# Patient Record
Sex: Male | Born: 1975 | Race: Black or African American | Hispanic: No | Marital: Single | Smoking: Never smoker
Health system: Southern US, Community
[De-identification: ages and names within clinical notes are randomized; demographics above are authoritative.]

## PROBLEM LIST (undated history)

## (undated) DIAGNOSIS — N200 Calculus of kidney: Secondary | ICD-10-CM

## (undated) DIAGNOSIS — K760 Fatty (change of) liver, not elsewhere classified: Secondary | ICD-10-CM

## (undated) DIAGNOSIS — E119 Type 2 diabetes mellitus without complications: Secondary | ICD-10-CM

## (undated) DIAGNOSIS — I10 Essential (primary) hypertension: Secondary | ICD-10-CM

## (undated) HISTORY — PX: APPENDECTOMY: SHX54

## (undated) HISTORY — DX: Essential (primary) hypertension: I10

## (undated) HISTORY — DX: Fatty (change of) liver, not elsewhere classified: K76.0

---

## 2016-02-27 ENCOUNTER — Emergency Department: Payer: BLUE CROSS/BLUE SHIELD

## 2016-02-27 ENCOUNTER — Emergency Department
Admission: EM | Admit: 2016-02-27 | Discharge: 2016-02-27 | Disposition: A | Discharge status: Home / Self Care | Payer: BLUE CROSS/BLUE SHIELD | Attending: Student | Admitting: Student

## 2016-02-27 ENCOUNTER — Encounter: Payer: Self-pay | Admitting: Emergency Medicine

## 2016-02-27 DIAGNOSIS — E119 Type 2 diabetes mellitus without complications: Secondary | ICD-10-CM | POA: Insufficient documentation

## 2016-02-27 DIAGNOSIS — N200 Calculus of kidney: Secondary | ICD-10-CM | POA: Diagnosis not present

## 2016-02-27 DIAGNOSIS — R109 Unspecified abdominal pain: Secondary | ICD-10-CM | POA: Diagnosis present

## 2016-02-27 HISTORY — DX: Calculus of kidney: N20.0

## 2016-02-27 HISTORY — DX: Type 2 diabetes mellitus without complications: E11.9

## 2016-02-27 LAB — URINALYSIS COMPLETE WITH MICROSCOPIC (ARMC ONLY)
BACTERIA UA: NONE SEEN
BILIRUBIN URINE: NEGATIVE
Ketones, ur: NEGATIVE mg/dL
LEUKOCYTES UA: NEGATIVE
Nitrite: NEGATIVE
Protein, ur: 100 mg/dL — AB
SQUAMOUS EPITHELIAL / LPF: NONE SEEN
Specific Gravity, Urine: 1.03 (ref 1.005–1.030)
WBC UA: NONE SEEN WBC/hpf (ref 0–5)
pH: 5 (ref 5.0–8.0)

## 2016-02-27 LAB — CBC
HEMATOCRIT: 48.8 % (ref 40.0–52.0)
HEMOGLOBIN: 16.4 g/dL (ref 13.0–18.0)
MCH: 28.4 pg (ref 26.0–34.0)
MCHC: 33.6 g/dL (ref 32.0–36.0)
MCV: 84.5 fL (ref 80.0–100.0)
PLATELETS: 347 10*3/uL (ref 150–440)
RBC: 5.78 MIL/uL (ref 4.40–5.90)
RDW: 14 % (ref 11.5–14.5)
WBC: 11.4 10*3/uL — ABNORMAL HIGH (ref 3.8–10.6)

## 2016-02-27 LAB — BASIC METABOLIC PANEL
Anion gap: 8 (ref 5–15)
BUN: 22 mg/dL — ABNORMAL HIGH (ref 6–20)
CHLORIDE: 104 mmol/L (ref 101–111)
CO2: 26 mmol/L (ref 22–32)
CREATININE: 1.72 mg/dL — AB (ref 0.61–1.24)
Calcium: 9.6 mg/dL (ref 8.9–10.3)
GFR calc non Af Amer: 47 mL/min — ABNORMAL LOW (ref >60)
GFR, EST AFRICAN AMERICAN: 55 mL/min — AB (ref >60)
Glucose, Bld: 200 mg/dL — ABNORMAL HIGH (ref 65–99)
Potassium: 4.1 mmol/L (ref 3.5–5.1)
Sodium: 138 mmol/L (ref 135–145)

## 2016-02-27 MED ORDER — ONDANSETRON HCL 4 MG/2ML IJ SOLN
4.0000 mg | Freq: Once | INTRAMUSCULAR | Status: AC
  Administered 2016-02-27: 4 mg via INTRAVENOUS

## 2016-02-27 MED ORDER — MORPHINE SULFATE (PF) 4 MG/ML IV SOLN
INTRAVENOUS | Status: AC
  Filled 2016-02-27: qty 1

## 2016-02-27 MED ORDER — NAPROXEN 500 MG PO TABS: 500.0000 mg | ORAL_TABLET | Freq: Two times a day (BID) | ORAL | Status: AC

## 2016-02-27 MED ORDER — SODIUM CHLORIDE 0.9 % IV BOLUS (SEPSIS)
1000.0000 mL | Freq: Once | INTRAVENOUS | Status: AC
  Administered 2016-02-27: 1000 mL via INTRAVENOUS

## 2016-02-27 MED ORDER — KETOROLAC TROMETHAMINE 30 MG/ML IJ SOLN
30.0000 mg | Freq: Once | INTRAMUSCULAR | Status: AC
  Administered 2016-02-27: 30 mg via INTRAVENOUS
  Filled 2016-02-27: qty 1

## 2016-02-27 MED ORDER — MORPHINE SULFATE (PF) 4 MG/ML IV SOLN
4.0000 mg | Freq: Once | INTRAVENOUS | Status: AC
  Administered 2016-02-27: 4 mg via INTRAVENOUS

## 2016-02-27 MED ORDER — ONDANSETRON HCL 4 MG/2ML IJ SOLN
INTRAMUSCULAR | Status: AC
  Filled 2016-02-27: qty 2

## 2016-02-27 MED ORDER — OXYCODONE HCL 5 MG PO TABS: 5.0000 mg | ORAL_TABLET | Freq: Three times a day (TID) | ORAL | Status: AC | PRN

## 2016-02-27 NOTE — Discharge Instructions (Signed)
Kidney Stones °Kidney stones (urolithiasis) are deposits that form inside your kidneys. The intense pain is caused by the stone moving through the urinary tract. When the stone moves, the ureter goes into spasm around the stone. The stone is usually passed in the urine.  °CAUSES  °· A disorder that makes certain neck glands produce too much parathyroid hormone (primary hyperparathyroidism). °· A buildup of uric acid crystals, similar to gout in your joints. °· Narrowing (stricture) of the ureter. °· A kidney obstruction present at birth (congenital obstruction). °· Previous surgery on the kidney or ureters. °· Numerous kidney infections. °SYMPTOMS  °· Feeling sick to your stomach (nauseous). °· Throwing up (vomiting). °· Blood in the urine (hematuria). °· Pain that usually spreads (radiates) to the groin. °· Frequency or urgency of urination. °DIAGNOSIS  °· Taking a history and physical exam. °· Blood or urine tests. °· CT scan. °· Occasionally, an examination of the inside of the urinary bladder (cystoscopy) is performed. °TREATMENT  °· Observation. °· Increasing your fluid intake. °· Extracorporeal shock wave lithotripsy--This is a noninvasive procedure that uses shock waves to break up kidney stones. °· Surgery may be needed if you have severe pain or persistent obstruction. There are various surgical procedures. Most of the procedures are performed with the use of small instruments. Only small incisions are needed to accommodate these instruments, so recovery time is minimized. °The size, location, and chemical composition are all important variables that will determine the proper choice of action for you. Talk to your health care provider to better understand your situation so that you will minimize the risk of injury to yourself and your kidney.  °HOME CARE INSTRUCTIONS  °· Drink enough water and fluids to keep your urine clear or pale yellow. This will help you to pass the stone or stone fragments. °· Strain  all urine through the provided strainer. Keep all particulate matter and stones for your health care provider to see. The stone causing the pain may be as small as a grain of salt. It is very important to use the strainer each and every time you pass your urine. The collection of your stone will allow your health care provider to analyze it and verify that a stone has actually passed. The stone analysis will often identify what you can do to reduce the incidence of recurrences. °· Only take over-the-counter or prescription medicines for pain, discomfort, or fever as directed by your health care provider. °· Keep all follow-up visits as told by your health care provider. This is important. °· Get follow-up X-rays if required. The absence of pain does not always mean that the stone has passed. It may have only stopped moving. If the urine remains completely obstructed, it can cause loss of kidney function or even complete destruction of the kidney. It is your responsibility to make sure X-rays and follow-ups are completed. Ultrasounds of the kidney can show blockages and the status of the kidney. Ultrasounds are not associated with any radiation and can be performed easily in a matter of minutes. °· Make changes to your daily diet as told by your health care provider. You may be told to: °¨ Limit the amount of salt that you eat. °¨ Eat 5 or more servings of fruits and vegetables each day. °¨ Limit the amount of meat, poultry, fish, and eggs that you eat. °· Collect a 24-hour urine sample as told by your health care provider. You may need to collect another urine sample every 6-12   months. °SEEK MEDICAL CARE IF: °· You experience pain that is progressive and unresponsive to any pain medicine you have been prescribed. °SEEK IMMEDIATE MEDICAL CARE IF:  °· Pain cannot be controlled with the prescribed medicine. °· You have a fever or shaking chills. °· The severity or intensity of pain increases over 18 hours and is not  relieved by pain medicine. °· You develop a new onset of abdominal pain. °· You feel faint or pass out. °· You are unable to urinate. °  °This information is not intended to replace advice given to you by your health care provider. Make sure you discuss any questions you have with your health care provider. °  °Document Released: 09/20/2005 Document Revised: 06/11/2015 Document Reviewed: 02/21/2013 °Elsevier Interactive Patient Education ©2016 [employer] ° °

## 2016-02-27 NOTE — ED Provider Notes (Signed)
[facility] Emergency Department Provider Note  ___________________________________________  Time seen: Approximately 4:58 PM  I have reviewed the triage vital signs and the nursing notes.   HISTORY  Chief Complaint Flank Pain  HPI Kyle Barrett is a 40 y.o. male who presents to the emergency department for evaluation of right flank pain. Pain started gradually on Wednesday and increased significantly this morning. He has a history of renal stone--last in 2015 that was "too big to pass." He went in for lithotripsy, but states they were unable to locate the stone. He reports having blood in his urine this morning, which is a new symptom. He denies nausea or vomiting. He states that he took an aspirin for pain.   Past Medical History  Diagnosis Date  . Diabetes mellitus without complication (HCC)   . Kidney stones     There are no active problems to display for this patient.   Past Surgical History  Procedure Laterality Date  . Appendectomy      Current Outpatient Rx  Name  Route  Sig  Dispense  Refill  . glipiZIDE (GLUCOTROL) 10 MG tablet   Oral   Take 10 mg by mouth daily before breakfast.         . sitaGLIPtin-metformin (JANUMET) 50-500 MG tablet   Oral   Take 1 tablet by mouth 2 (two) times daily with a meal.         . Dulaglutide (TRULICITY) 1.5 MG/0.5ML SOPN   Subcutaneous   Inject into the skin.         . naproxen (NAPROSYN) 500 MG tablet   Oral   Take 1 tablet (500 mg total) by mouth 2 (two) times daily with a meal.   60 tablet   0   . oxyCODONE (ROXICODONE) 5 MG immediate release tablet   Oral   Take 1 tablet (5 mg total) by mouth every 8 (eight) hours as needed.   20 tablet   0     Allergies Review of patient's allergies indicates no known allergies.  No family history on file.  Social History Social History  Substance Use Topics  . Smoking status: Never Smoker   . Smokeless tobacco: None  . Alcohol  Use: No    Review of Systems Constitutional: No fever/chills Eyes: No visual changes. ENT: No sore throat. Cardiovascular: Denies chest pain. Respiratory: Denies shortness of breath. Gastrointestinal: No abdominal pain.  No nausea, no vomiting. Genitourinary: Positive for dysuria. Musculoskeletal: Positive for back pain. Skin: Negative for rash. Neurological: Negative for headaches, focal weakness or numbness. ____________________________________________   PHYSICAL EXAM:  VITAL SIGNS: ED Triage Vitals  Enc Vitals Group     BP 02/27/16 1624 153/104 mmHg     Pulse Rate 02/27/16 1624 120     Resp 02/27/16 1624 20     Temp 02/27/16 1624 98.6 F (37 C)     Temp Source 02/27/16 1624 Oral     SpO2 02/27/16 1624 98 %     Weight 02/27/16 1624 245 lb (111.131 kg)     Height 02/27/16 1624  (1.88 m)     Head Cir --      Peak Flow --      Pain Score 02/27/16 1622 8     Pain Loc --      Pain Edu? --      Excl. in GC? --     Constitutional: Alert and oriented. Uncomfortable appearing and in no acute distress. Eyes:  Conjunctivae are normal. PERRL. EOMI. Head: Atraumatic. Respiratory: Normal respiratory effort.  No retractions. Lungs CTAB. Gastrointestinal: Soft and nontender. No distention. No abdominal bruits. CVA tenderness on the right. Musculoskeletal: No lower extremity tenderness nor edema.  No joint effusions. Neurologic:  Normal speech and language. No gross focal neurologic deficits are appreciated. No gait instability. Skin:  Skin is warm, dry and intact. No rash noted. Psychiatric: Mood and affect are normal. Speech and behavior are normal.  ____________________________________________   LABS (all labs ordered are listed, but only abnormal results are displayed)  Labs Reviewed  URINALYSIS COMPLETEWITH MICROSCOPIC ([facility] ONLY) - Abnormal; Notable for the following:    Color, Urine STRAW (*)    APPearance CLEAR (*)    Glucose, UA >500 (*)    Hgb urine dipstick  3+ (*)    Protein, ur 100 (*)    All other components within normal limits  BASIC METABOLIC PANEL - Abnormal; Notable for the following:    Glucose, Bld 200 (*)    BUN 22 (*)    Creatinine, Ser 1.72 (*)    GFR calc non Af Amer 47 (*)    GFR calc Af Amer 55 (*)    All other components within normal limits  CBC - Abnormal; Notable for the following:    WBC 11.4 (*)    All other components within normal limits   ____________________________________________  EKG   ____________________________________________  RADIOLOGY  Four stones in the UVJ ranging from 2-734mm causing mild obstructive change per radiology. ____________________________________________   PROCEDURES  Procedure(s) performed: None  Critical Care performed: No  ____________________________________________   INITIAL IMPRESSION / ASSESSMENT AND PLAN / ED COURSE  Pertinent labs & imaging results that were available during my care of the patient were reviewed by me and considered in my medical decision making (see chart for details).  Pain well controlled after IV fluids and administration of Morphine and Toradol. He will be discharged home with Roxicet and advised to follow up with urology. He was advised to return to the ER for symptoms that change or worsen or for new concerns.  ____________________________________________   FINAL CLINICAL IMPRESSION(S) / ED DIAGNOSES  Final diagnoses:  Nephrolithiasis      NEW MEDICATIONS STARTED DURING THIS VISIT:  Discharge Medication List as of 02/27/2016  6:43 PM    START taking these medications   Details  naproxen (NAPROSYN) 500 MG tablet Take 1 tablet (500 mg total) by mouth 2 (two) times daily with a meal., Starting 02/27/2016, Until Sat 02/26/17, Print    oxyCODONE (ROXICODONE) 5 MG immediate release tablet Take 1 tablet (5 mg total) by mouth every 8 (eight) hours as needed., Starting 02/27/2016, Until Sat 02/26/17, Print         Note:  This document was  prepared using Dragon voice recognition software and may include unintentional dictation errors.    Chinita PesterCari B Camauri Craton, FNP 02/28/16 1006  Myrna Blazeravid Matthew Schaevitz, MD 02/29/16 [phone]

## 2016-02-27 NOTE — ED Notes (Signed)
Sudden onset of right flank pain with some urinary discomfort  Voiding small amts with some noticeable blood in urine

## 2018-03-08 ENCOUNTER — Telehealth: Payer: Self-pay | Admitting: General Practice

## 2018-03-08 NOTE — Telephone Encounter (Signed)
Lm on vm to call office to set up a new patient appt with Dr. Jearld PiesMcClean or M. Arnett.

## 2018-04-27 ENCOUNTER — Encounter

## 2018-04-28 ENCOUNTER — Ambulatory Visit: Payer: BLUE CROSS/BLUE SHIELD | Admitting: Internal Medicine

## 2018-07-14 ENCOUNTER — Ambulatory Visit (INDEPENDENT_AMBULATORY_CARE_PROVIDER_SITE_OTHER): Payer: BLUE CROSS/BLUE SHIELD | Admitting: Internal Medicine

## 2018-07-14 ENCOUNTER — Encounter: Payer: Self-pay | Admitting: Internal Medicine

## 2018-07-14 VITALS — BP 140/80 | HR 102 | Temp 98.1°F | Resp 16 | Ht 74.5 in | Wt 253.0 lb

## 2018-07-14 DIAGNOSIS — E1165 Type 2 diabetes mellitus with hyperglycemia: Secondary | ICD-10-CM | POA: Diagnosis not present

## 2018-07-14 DIAGNOSIS — Z794 Long term (current) use of insulin: Secondary | ICD-10-CM

## 2018-07-14 DIAGNOSIS — M25561 Pain in right knee: Secondary | ICD-10-CM | POA: Insufficient documentation

## 2018-07-14 DIAGNOSIS — K760 Fatty (change of) liver, not elsewhere classified: Secondary | ICD-10-CM | POA: Diagnosis not present

## 2018-07-14 DIAGNOSIS — I10 Essential (primary) hypertension: Secondary | ICD-10-CM | POA: Diagnosis not present

## 2018-07-14 DIAGNOSIS — Z87442 Personal history of urinary calculi: Secondary | ICD-10-CM | POA: Insufficient documentation

## 2018-07-14 NOTE — Progress Notes (Addendum)
Chief Complaint  Patient presents with  . New Patient (Initial Visit)  . Knee Pain    right knee, cannot extend out   F/u  1. HTN sl elevated today goal 130/80 on lis 40 mg qd  2. DM 2 uncontrolled A1C 8.7 06/2028 f/u Dr. Gabriel Carina on Sol[place]a 100/33, glipizide 10 mg bid, Xigduo bid f/u 09/2018 labs and f/u Dr. Gabriel Carina  3. C/o right knee pain 0/10 but pain with ROM pain is middle of knee with reduced ROM x 3 weeks  4. Fatty liver noted on CT 02/27/16    Review of Systems  Constitutional: Negative for weight loss.  HENT: Negative for hearing loss.   Eyes: Negative for blurred vision.  Respiratory: Negative for shortness of breath.   Cardiovascular: Negative for chest pain.  Gastrointestinal: Negative for abdominal pain.  Musculoskeletal: Positive for back pain.  Skin: Negative for rash.  Neurological: Negative for headaches.  Psychiatric/Behavioral: Negative for depression.   Past Medical History:  Diagnosis Date  . Diabetes mellitus without complication ([place])    2 since 89s Dr. Gabriel Carina   . Fatty liver    CT 02/27/16   . Hypertension   . Kidney stones    Past Surgical History:  Procedure Laterality Date  . APPENDECTOMY     1984   Family History  Problem Relation Age of Onset  . Diabetes Paternal Grandmother    Social History   Socioeconomic History  . Marital status: Single    Spouse name: Not on file  . Number of children: Not on file  . Years of education: Not on file  . Highest education level: Not on file  Occupational History  . Not on file  Social Needs  . Financial resource strain: Not on file  . Food insecurity:    Worry: Not on file    Inability: Not on file  . Transportation needs:    Medical: Not on file    Non-medical: Not on file  Tobacco Use  . Smoking status: Never Smoker  . Smokeless tobacco: Never Used  Substance and Sexual Activity  . Alcohol use: No  . Drug use: Never  . Sexual activity: Yes  Lifestyle  . Physical activity:    Days per week:  Not on file    Minutes per session: Not on file  . Stress: Not on file  Relationships  . Social connections:    Talks on phone: Not on file    Gets together: Not on file    Attends religious service: Not on file    Active member of club or organization: Not on file    Attends meetings of clubs or organizations: Not on file    Relationship status: Not on file  . Intimate partner violence:    Fear of current or ex partner: Not on file    Emotionally abused: Not on file    Physically abused: Not on file    Forced sexual activity: Not on file  Other Topics Concern  . Not on file  Social History Narrative   Never smoker    Works [employer] manager    Owns guns, wears seat belt safe in relationship    Bachelors degree    Single    Current Meds  Medication Sig  . Dapagliflozin-metFORMIN HCl ER 02-999 MG TB24 Take 1 tablet by mouth 2 (two) times daily.   Marland Kitchen glipiZIDE (GLUCOTROL) 10 MG tablet Take 10 mg by mouth 2 (two) times daily before a meal.   .  lisinopril (PRINIVIL,ZESTRIL) 40 MG tablet Take 40 mg by mouth daily.   . SOLIQUA 100-33 UNT-MCG/ML SOPN INJECT 56 UNITS SUBCUTANEOUSLY ONCE DAILY  . [DISCONTINUED] Dulaglutide (TRULICITY) 1.5 OY/9.2DG SOPN Inject into the skin.  . [DISCONTINUED] sitaGLIPtin-metformin (JANUMET) 50-500 MG tablet Take 1 tablet by mouth 2 (two) times daily with a meal.   No Known Allergies No results found for this or any previous visit (from the past 2160 hour(s)). Objective  Body mass index is 32.05 kg/m. Wt Readings from Last 3 Encounters:  07/14/18 253 lb (114.8 kg)  02/27/16 245 lb (111.1 kg)   Temp Readings from Last 3 Encounters:  07/14/18 98.1 F (36.7 C) (Oral)  02/27/16 98.6 F (37 C) (Oral)   BP Readings from Last 3 Encounters:  07/14/18 140/80  02/27/16 140/87   Pulse Readings from Last 3 Encounters:  07/14/18 (!) 102  02/27/16 (!) 102    Physical Exam  Constitutional: He is oriented to person, place, and time. Vital  signs are normal. He appears well-developed and well-nourished. He is cooperative.  HENT:  Head: Normocephalic and atraumatic.  Mouth/Throat: Oropharynx is clear and moist and mucous membranes are normal.  Eyes: Pupils are equal, round, and reactive to light. Conjunctivae are normal.  Cardiovascular: Normal rate, regular rhythm and normal heart sounds.  Pulmonary/Chest: Effort normal and breath sounds normal.  Musculoskeletal:       Right knee: Tenderness found. Medial joint line tenderness noted.  Neurological: He is alert and oriented to person, place, and time. Gait normal.  Skin: Skin is warm, dry and intact.  Psychiatric: He has a normal mood and affect. His speech is normal and behavior is normal. Judgment and thought content normal. Cognition and memory are normal.  Nursing note and vitals reviewed.   Assessment   1. HTN 2. DM 2 A1C 8.7 06/09/18  -10/22/17 eye exam   3. Right medial knee pain  4. HM 5. Fatty liver  Plan   1.  Cont lis 40 and consider increase to lis hct 20-12.5 2 pills at f/u if not controlled 2.  sch fasting labs with Dr. Gabriel Carina CMET, CBC, A1C, lipid, UA, TSH, T4, vitamin D declines MMR and hep B check will ask pt gets labs done [facility]  Not on statin though rec and disc'ed today Declines pna 23 for now  eye exam 11/01/2017 [facility] [place]  Do foot exam at f/u  Urine protein due 11/11/2018 Cont meds and f/u endocrine 09/2018 3. Xray to sch right knee  Consider MRI if not improved  4.  Declines flu shot  Tdap disc and rec today  Declines pna 23  Declines MMR and hep B   Declines STD check  PSA due 12/16/18 with DRE   5. Given info fatty liver   Provider: Dr. Olivia Mackie McLean-Scocuzza-Internal Medicine

## 2018-07-14 NOTE — Patient Instructions (Addendum)
rec Tdap vaccine every 10 years  Think about pneumonia 23 vaccine  Xray right knee here   Nonalcoholic Fatty Liver Disease Diet Nonalcoholic fatty liver disease is a condition that causes fat to accumulate in and around the liver. The disease makes it harder for the liver to work the way that it should. Following a healthy diet can help to keep nonalcoholic fatty liver disease under control. It can also help to prevent or improve conditions that are associated with the disease, such as heart disease, diabetes, high blood pressure, and abnormal cholesterol levels. Along with regular exercise, this diet:  Promotes weight loss.  Helps to control blood sugar levels.  Helps to improve the way that the body uses insulin.  What do I need to know about this diet?  Use the glycemic index (GI) to plan your meals. The index tells you how quickly a food will raise your blood sugar. Choose low-GI foods. These foods take a longer time to raise blood sugar.  Keep track of how many calories you take in. Eating the right amount of calories will help you to achieve a healthy weight.  You may want to follow a Mediterranean diet. This diet includes a lot of vegetables, lean meats or fish, whole grains, fruits, and healthy oils and fats. What foods can I eat? Grains Whole grains, such as whole-wheat or whole-grain breads, crackers, tortillas, cereals, and pasta. Stone-ground whole wheat. Pumpernickel bread. Unsweetened oatmeal. Bulgur. Barley. Quinoa. Brown or wild rice. Corn or whole-wheat flour tortillas. Vegetables Lettuce. Spinach. Peas. Beets. Cauliflower. Cabbage. Broccoli. Carrots. Tomatoes. Squash. Eggplant. Herbs. Peppers. Onions. Cucumbers. Brussels sprouts. Yams and sweet potatoes. Beans. Lentils. Fruits Bananas. Apples. Oranges. Grapes. Papaya. Mango. Pomegranate. Kiwi. Grapefruit. Cherries. Meats and Other Protein Sources Seafood and shellfish. Lean meats. Poultry. Tofu. Dairy Low-fat or  fat-free dairy products, such as yogurt, cottage cheese, and cheese. Beverages Water. Sugar-free drinks. Tea. Coffee. Low-fat or skim milk. Milk alternatives, such as soy or almond milk. Real fruit juice. Condiments Mustard. Relish. Low-fat, low-sugar ketchup and barbecue sauce. Low-fat or fat-free mayonnaise. Sweets and Desserts Sugar-free sweets. Fats and Oils Avocado. Canola or olive oil. Nuts and nut butters. Seeds. The items listed above may not be a complete list of recommended foods or beverages. Contact your dietitian for more options. What foods are not recommended? Palm oil and coconut oil. Processed foods. Fried foods. Sweetened drinks, such as sweet tea, milkshakes, snow cones, iced sweet drinks, and sodas. Alcohol. Sweets. Foods that contain a lot of salt or sodium. The items listed above may not be a complete list of foods and beverages to avoid. Contact your dietitian for more information. This information is not intended to replace advice given to you by your health care provider. Make sure you discuss any questions you have with your health care provider. Document Released: 02/04/2015 Document Revised: 02/26/2016 Document Reviewed: 10/15/2014 Elsevier Interactive Patient Education  2018 [employer] Inc[employer]  Fatty Liver Fatty liver, also called hepatic steatosis or steatohepatitis, is a condition in which too much fat has built up in your liver cells. The liver removes harmful substances from your bloodstream. It produces fluids your body needs. It also helps your body use and store energy from the food you eat. In many cases, fatty liver does not cause symptoms or problems. It is often diagnosed when tests are being done for other reasons. However, over time, fatty liver can cause inflammation that may lead to more serious liver problems, such as scarring of the  liver (cirrhosis). What are the causes? Causes of fatty liver may include:  Drinking too much alcohol.  Poor  nutrition.  Obesity.  Cushing syndrome.  Diabetes.  Hyperlipidemia.  Pregnancy.  Certain drugs.  Poisons.  Some viral infections.  What increases the risk? You may be more likely to develop fatty liver if you:  Abuse alcohol.  Are pregnant.  Are overweight.  Have diabetes.  Have hepatitis.  Have a high triglyceride level.  What are the signs or symptoms? Fatty liver often does not cause any symptoms. In cases where symptoms develop, they can include:  Fatigue.  Weakness.  Weight loss.  Confusion.  Abdominal pain.  Yellowing of your skin and the white parts of your eyes (jaundice).  Nausea and vomiting.  How is this diagnosed? Fatty liver may be diagnosed by:  Physical exam and medical history.  Blood tests.  Imaging tests, such as an ultrasound, CT scan, or MRI.  Liver biopsy. A small sample of liver tissue is removed using a needle. The sample is then looked at under a microscope.  How is this treated? Fatty liver is often caused by other health conditions. Treatment for fatty liver may involve medicines and lifestyle changes to manage conditions such as:  Alcoholism.  High cholesterol.  Diabetes.  Being overweight or obese.  Follow these instructions at home:  Eat a healthy diet as directed by your health care provider.  Exercise regularly. This can help you lose weight and control your cholesterol and diabetes. Talk to your health care provider about an exercise plan and which activities are best for you.  Do not drink alcohol.  Take medicines only as directed by your health care provider. Contact a health care provider if: You have difficulty controlling your:  Blood sugar.  Cholesterol.  Alcohol consumption.  Get help right away if:  You have abdominal pain.  You have jaundice.  You have nausea and vomiting. This information is not intended to replace advice given to you by your health care provider. Make sure you  discuss any questions you have with your health care provider. Document Released: 11/05/2005 Document Revised: 02/26/2016 Document Reviewed: 01/30/2014 Elsevier Interactive Patient Education  2018 [employer].   DTaP Vaccine (Diphtheria, Tetanus, and Pertussis): What You Need to Know 1. Why get vaccinated? Diphtheria, tetanus, and pertussis are serious diseases caused by bacteria. Diphtheria and pertussis are spread from person to person. Tetanus enters the body through cuts or wounds. DIPHTHERIA causes a thick covering in the back of the throat.  It can lead to breathing problems, paralysis, heart failure, and even death.  TETANUS (Lockjaw) causes painful tightening of the muscles, usually all over the body.  It can lead to "locking" of the jaw so the victim cannot open his mouth or swallow. Tetanus leads to death in up to 2 out of 10 cases.  PERTUSSIS (Whooping Cough) causes coughing spells so bad that it is hard for infants to eat, drink, or breathe. These spells can last for weeks.  It can lead to pneumonia, seizures (jerking and staring spells), brain damage, and death.  Diphtheria, tetanus, and pertussis vaccine (DTaP) can help prevent these diseases. Most children who are vaccinated with DTaP will be protected throughout childhood. Many more children would get these diseases if we stopped vaccinating. DTaP is a safer version of an older vaccine called DTP. DTP is no longer used in the [place]. 2. Who should get DTaP vaccine and when? Children should get 5 doses of  DTaP vaccine, one dose at each of the following ages:  2 months  4 months  6 months  15-18 months  4-6 years  DTaP may be given at the same time as other vaccines. 3. Some children should not get DTaP vaccine or should wait  Children with minor illnesses, such as a cold, may be vaccinated. But children who are moderately or severely ill should usually wait until they recover before getting DTaP  vaccine.  Any child who had a life-threatening allergic reaction after a dose of DTaP should not get another dose.  Any child who suffered a brain or nervous system disease within 7 days after a dose of DTaP should not get another dose.  Talk with your doctor if your child: ? had a seizure or collapsed after a dose of DTaP, ? cried non-stop for 3 hours or more after a dose of DTaP, ? had a fever over 105F after a dose of DTaP. Ask your doctor for more information. Some of these children should not get another dose of pertussis vaccine, but may get a vaccine without pertussis, called DT. 4. Older children and adults DTaP is not licensed for adolescents, adults, or children 61 years of age and older. But older people still need protection. A vaccine called Tdap is similar to DTaP. A single dose of Tdap is recommended for people 11 through 44 years of age. Another vaccine, called Td, protects against tetanus and diphtheria, but not pertussis. It is recommended every 10 years. There are separate Vaccine Information Statements for these vaccines. 5. What are the risks from DTaP vaccine? Getting diphtheria, tetanus, or pertussis disease is much riskier than getting DTaP vaccine. However, a vaccine, like any medicine, is capable of causing serious problems, such as severe allergic reactions. The risk of DTaP vaccine causing serious harm, or death, is extremely small. Mild problems (common)  Fever (up to about 1 child in 4)  Redness or swelling where the shot was given (up to about 1 child in 4)  Soreness or tenderness where the shot was given (up to about 1 child in 4) These problems occur more often after the 4th and 5th doses of the DTaP series than after earlier doses. Sometimes the 4th or 5th dose of DTaP vaccine is followed by swelling of the entire arm or leg in which the shot was given, lasting 1-7 days (up to about 1 child in 30). Other mild problems include:  Fussiness (up to about 1  child in 3)  Tiredness or poor appetite (up to about 1 child in 10)  Vomiting (up to about 1 child in 50) These problems generally occur 1-3 days after the shot. Moderate problems (uncommon)  Seizure (jerking or staring) (about 1 child out of 14,000)  Non-stop crying, for 3 hours or more (up to about 1 child out of 1,000)  High fever, over 105F (about 1 child out of 16,000) Severe problems (very rare)  Serious allergic reaction (less than 1 out of a million doses)  Several other severe problems have been reported after DTaP vaccine. These include: ? Long-term seizures, coma, or lowered consciousness ? Permanent brain damage. These are so rare it is hard to tell if they are caused by the vaccine. Controlling fever is especially important for children who have had seizures, for any reason. It is also important if another family member has had seizures. You can reduce fever and pain by giving your child an aspirin-free pain reliever when the shot  is given, and for the next 24 hours, following the package instructions. 6. What if there is a serious reaction? What should I look for? Look for anything that concerns you, such as signs of a severe allergic reaction, very high fever, or behavior changes. Signs of a severe allergic reaction can include hives, swelling of the face and throat, difficulty breathing, a fast heartbeat, dizziness, and weakness. These would start a few minutes to a few hours after the vaccination. What should I do?  If you think it is a severe allergic reaction or other emergency that can't wait, call 9-1-1 or get the person to the nearest hospital. Otherwise, call your doctor.  Afterward, the reaction should be reported to the Vaccine Adverse Event Reporting System (VAERS). Your doctor might file this report, or you can do it yourself through the VAERS web site at www.vaers.LAgents.no, or by calling 1-714-196-9156. ? VAERS is only for reporting reactions. They do not  give medical advice. 7. The National Vaccine Injury Compensation Program The [employer] Vaccine Injury Compensation Program (VICP) is a federal program that was created to compensate people who may have been injured by certain vaccines. Persons who believe they may have been injured by a vaccine can learn about the program and about filing a claim by calling 1-3151399957 or visiting the VICP website at SpiritualWord.at. 8. How can I learn more?  Ask your doctor.  Call your local or state health department.  Contact the Centers for Disease Control and Prevention (CDC): ? Call 8258399255 (1-800-CDC-INFO) or ? Visit CDC's website at PicCapture.uy CDC DTaP Vaccine (Diphtheria, Tetanus, and Pertussis) VIS (02/17/06) This information is not intended to replace advice given to you by your health care provider. Make sure you discuss any questions you have with your health care provider. Document Released: 07/18/2006 Document Revised: 06/10/2016 Document Reviewed: 06/10/2016 Elsevier Interactive Patient Education  2017 Elsevier Inc.   Pneumococcal Polysaccharide Vaccine: What You Need to Know 1. Why get vaccinated? Vaccination can protect older adults (and some children and younger adults) from pneumococcal disease. Pneumococcal disease is caused by bacteria that can spread from person to person through close contact. It can cause ear infections, and it can also lead to more serious infections of the:  Lungs (pneumonia),  Blood (bacteremia), and  Covering of the brain and spinal cord (meningitis). Meningitis can cause deafness and brain damage, and it can be fatal.  Anyone can get pneumococcal disease, but children under 49 years of age, people with certain medical conditions, adults over 28 years of age, and cigarette smokers are at the highest risk. About 18,000 older adults die each year from pneumococcal disease in the [place]. Treatment of pneumococcal  infections with penicillin and other drugs used to be more effective. But some strains of the disease have become resistant to these drugs. This makes prevention of the disease, through vaccination, even more important. 2. Pneumococcal polysaccharide vaccine (PPSV23) Pneumococcal polysaccharide vaccine (PPSV23) protects against 23 types of pneumococcal bacteria. It will not prevent all pneumococcal disease. PPSV23 is recommended for:  All adults 81 years of age and older,  Anyone 2 through 44 years of age with certain long-term health problems,  Anyone 2 through 44 years of age with a weakened immune system,  Adults 54 through 44 years of age who smoke cigarettes or have asthma.  Most people need only one dose of PPSV. A second dose is recommended for certain high-risk groups. People 11 and older should get a dose even if  they have gotten one or more doses of the vaccine before they turned 65. Your healthcare provider can give you more information about these recommendations. Most healthy adults develop protection within 2 to 3 weeks of getting the shot. 3. Some people should not get this vaccine  Anyone who has had a life-threatening allergic reaction to PPSV should not get another dose.  Anyone who has a severe allergy to any component of PPSV should not receive it. Tell your provider if you have any severe allergies.  Anyone who is moderately or severely ill when the shot is scheduled may be asked to wait until they recover before getting the vaccine. Someone with a mild illness can usually be vaccinated.  Children less than 47 years of age should not receive this vaccine.  There is no evidence that PPSV is harmful to either a pregnant woman or to her fetus. However, as a precaution, women who need the vaccine should be vaccinated before becoming pregnant, if possible. 4. Risks of a vaccine reaction With any medicine, including vaccines, there is a chance of side effects. These are  usually mild and go away on their own, but serious reactions are also possible. About half of people who get PPSV have mild side effects, such as redness or pain where the shot is given, which go away within about two days. Less than 1 out of 100 people develop a fever, muscle aches, or more severe local reactions. Problems that could happen after any vaccine:  People sometimes faint after a medical procedure, including vaccination. Sitting or lying down for about 15 minutes can help prevent fainting, and injuries caused by a fall. Tell your doctor if you feel dizzy, or have vision changes or ringing in the ears.  Some people get severe pain in the shoulder and have difficulty moving the arm where a shot was given. This happens very rarely.  Any medication can cause a severe allergic reaction. Such reactions from a vaccine are very rare, estimated at about 1 in a million doses, and would happen within a few minutes to a few hours after the vaccination. As with any medicine, there is a very remote chance of a vaccine causing a serious injury or death. The safety of vaccines is always being monitored. For more information, visit: http://floyd.org/ 5. What if there is a serious reaction? What should I look for? Look for anything that concerns you, such as signs of a severe allergic reaction, very high fever, or unusual behavior. Signs of a severe allergic reaction can include hives, swelling of the face and throat, difficulty breathing, a fast heartbeat, dizziness, and weakness. These would usually start a few minutes to a few hours after the vaccination. What should I do? If you think it is a severe allergic reaction or other emergency that can't wait, call 9-1-1 or get to the nearest hospital. Otherwise, call your doctor. Afterward, the reaction should be reported to the Vaccine Adverse Event Reporting System (VAERS). Your doctor might file this report, or you can do it yourself through the  VAERS web site at www.vaers.LAgents.no, or by calling 1-317-208-4067. VAERS does not give medical advice. 6. How can I learn more?  Ask your doctor. He or she can give you the vaccine package insert or suggest other sources of information.  Call your local or state health department.  Contact the Centers for Disease Control and Prevention (CDC): ? Call (712)302-6948 (1-800-CDC-INFO) or ? Visit CDC's website at PicCapture.uy CDC Pneumococcal Polysaccharide Vaccine  VIS (01/25/14) This information is not intended to replace advice given to you by your health care provider. Make sure you discuss any questions you have with your health care provider. Document Released: 07/18/2006 Document Revised: 06/10/2016 Document Reviewed: 06/10/2016 Elsevier Interactive Patient Education  2017 [employer].

## 2018-07-25 ENCOUNTER — Ambulatory Visit (INDEPENDENT_AMBULATORY_CARE_PROVIDER_SITE_OTHER): Payer: BLUE CROSS/BLUE SHIELD

## 2018-07-25 DIAGNOSIS — M25561 Pain in right knee: Secondary | ICD-10-CM | POA: Diagnosis not present

## 2018-11-10 ENCOUNTER — Ambulatory Visit: Payer: BLUE CROSS/BLUE SHIELD | Admitting: Internal Medicine

## 2019-02-27 IMAGING — DX DG KNEE COMPLETE 4+V*R*
5 series · 5 of 5 positions shown · non-contrast
Comparison: No recent.

CLINICAL DATA: Knee pain for 3 weeks.

EXAM:
RIGHT KNEE - COMPLETE 4+ VIEW

[knee standing ap]
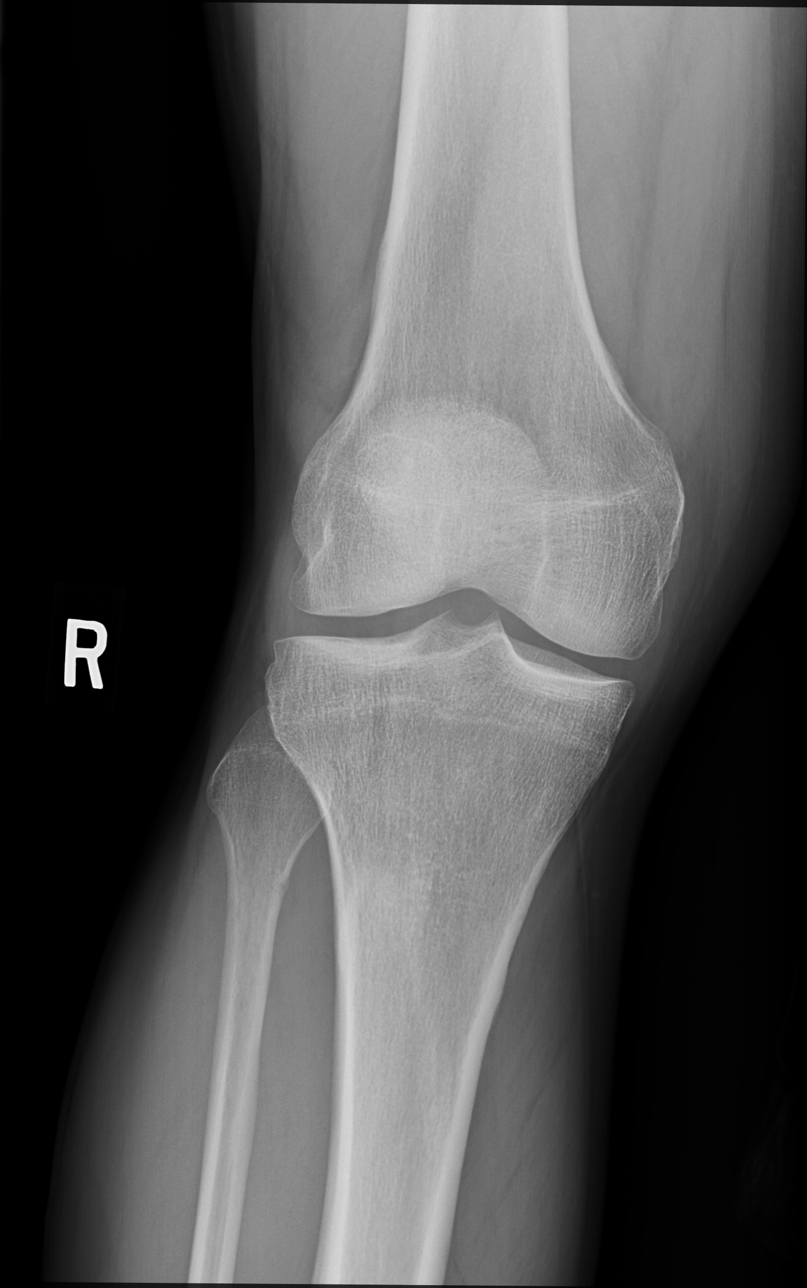

[knee standing external ap]
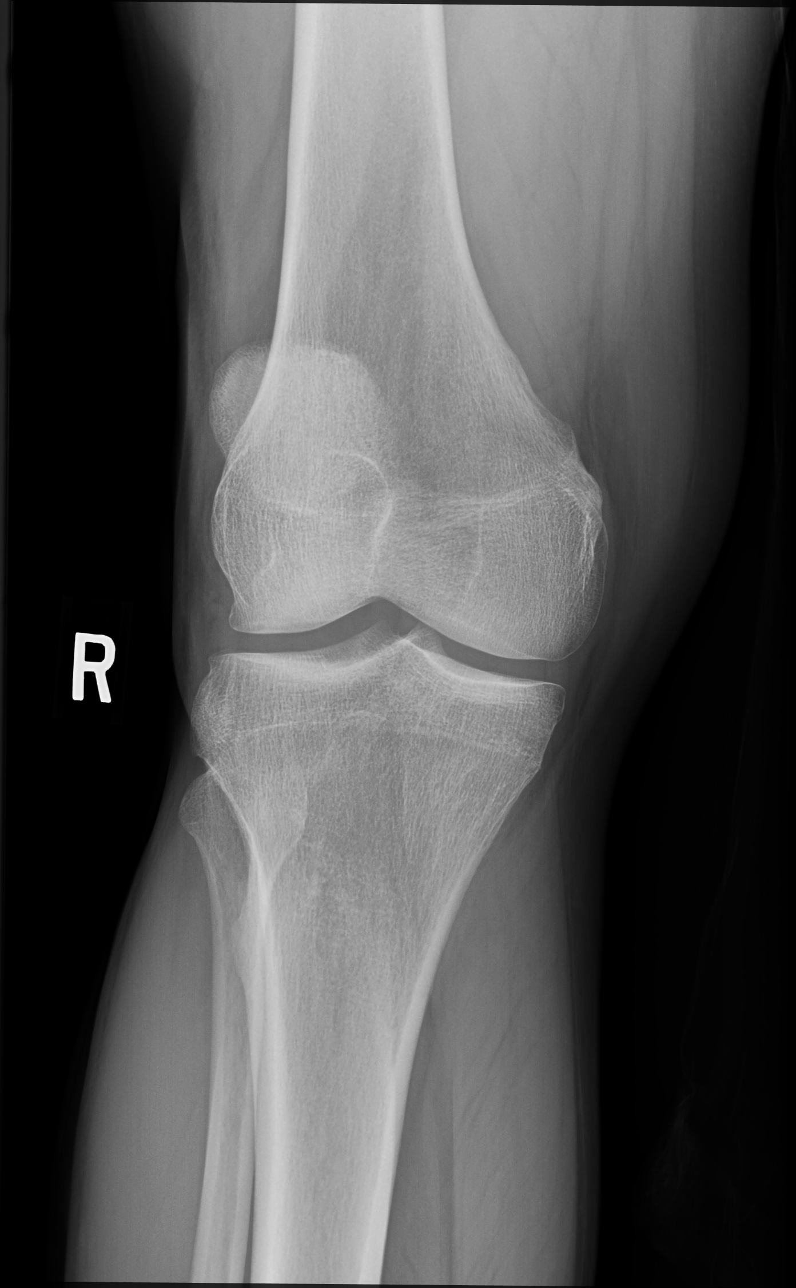

[knee standing internal ap]
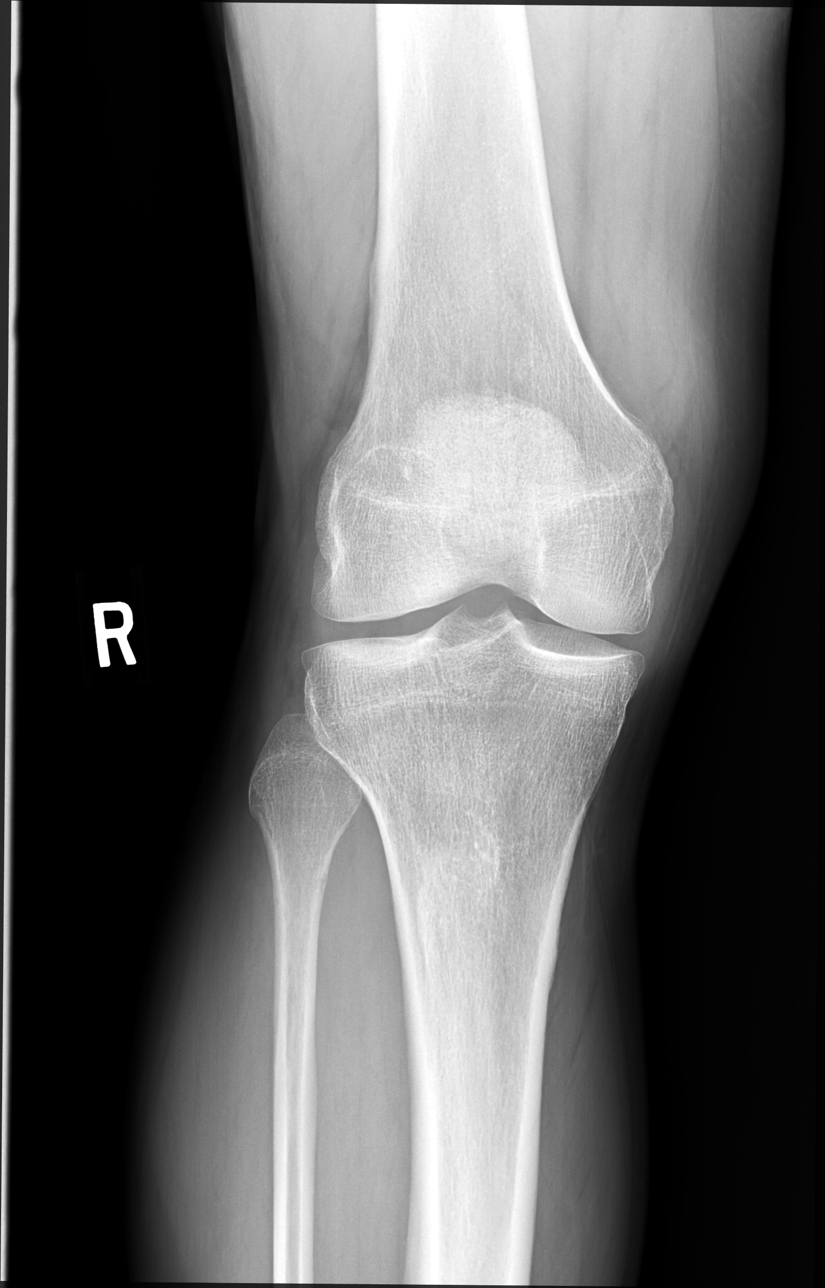

[knee standing lat]
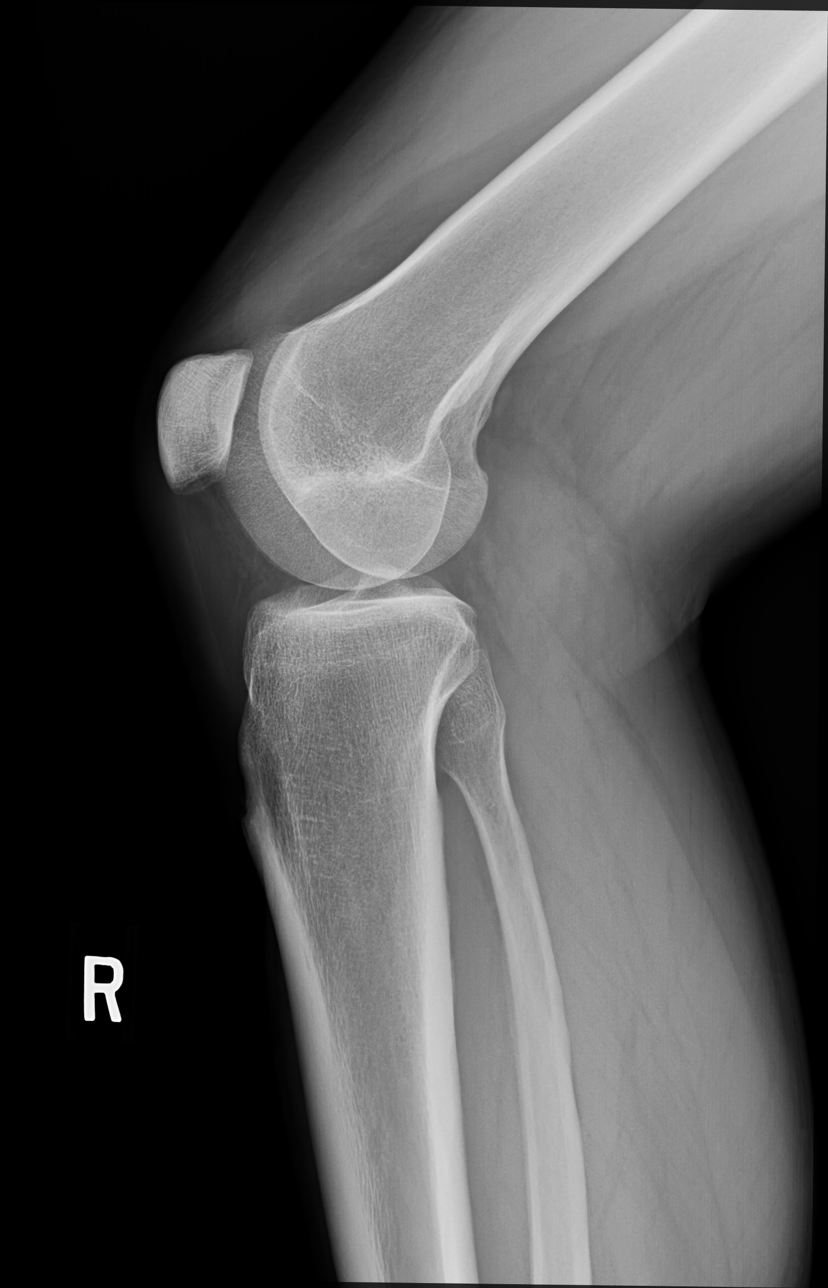

[knee [person_name] view pa]
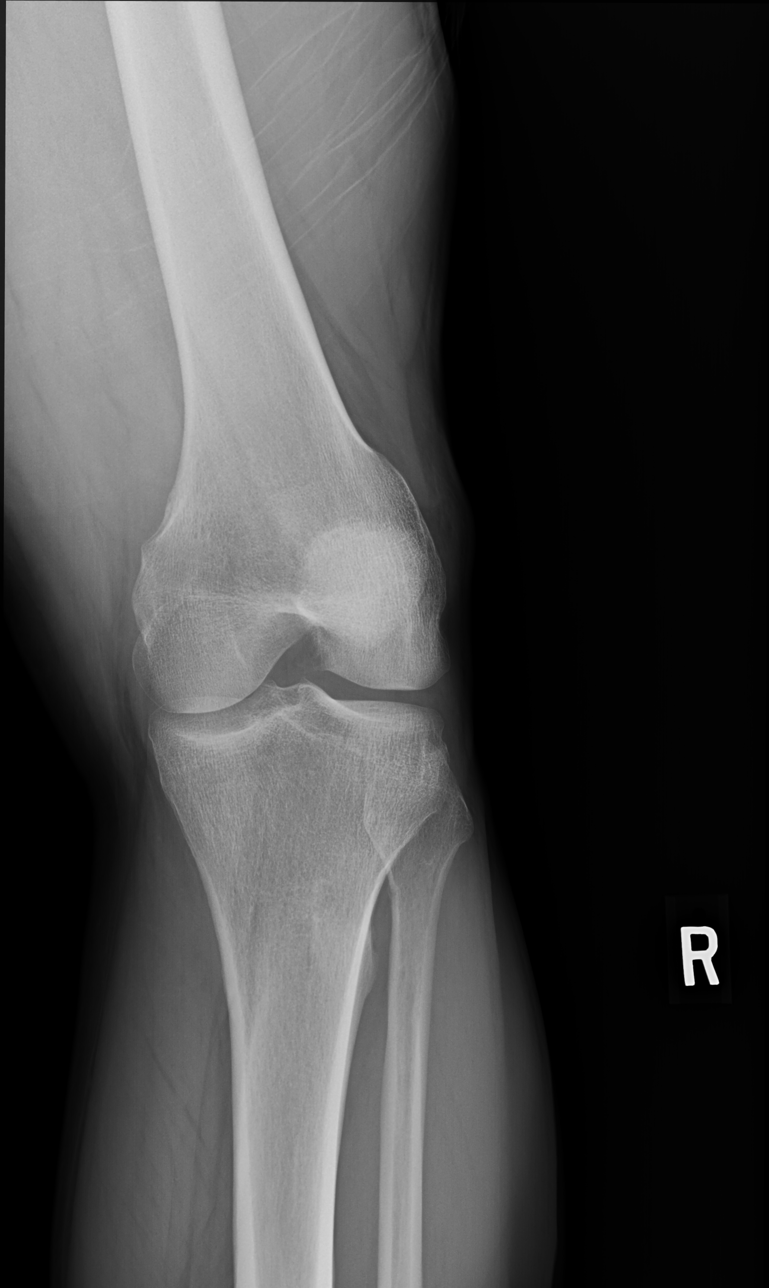

[5 of 5 positions shown; findings below may reference images not displayed]

FINDINGS: No acute bony no evidence of fracture or dislocation. Tiny effusion
cannot be excluded.
IMPRESSION: 1.  No acute bony abnormality.

2.  Tiny knee joint effusion cannot be excluded.
